# Patient Record
Sex: Male | Born: 1990 | Race: Black or African American | Hispanic: No | Marital: Single | Smoking: Never smoker
Health system: Southern US, Community
[De-identification: ages and names within clinical notes are randomized; demographics above are authoritative.]

---

## 2019-05-17 ENCOUNTER — Emergency Department: Payer: No Typology Code available for payment source

## 2019-05-17 ENCOUNTER — Emergency Department
Admission: EM | Admit: 2019-05-17 | Discharge: 2019-05-17 | Disposition: A | Discharge status: Home / Self Care | Payer: No Typology Code available for payment source | Attending: Emergency Medicine | Admitting: Emergency Medicine

## 2019-05-17 ENCOUNTER — Other Ambulatory Visit: Payer: Self-pay

## 2019-05-17 ENCOUNTER — Encounter: Payer: Self-pay | Admitting: Emergency Medicine

## 2019-05-17 DIAGNOSIS — Z79899 Other long term (current) drug therapy: Secondary | ICD-10-CM | POA: Insufficient documentation

## 2019-05-17 DIAGNOSIS — G8929 Other chronic pain: Secondary | ICD-10-CM | POA: Insufficient documentation

## 2019-05-17 DIAGNOSIS — M542 Cervicalgia: Secondary | ICD-10-CM | POA: Insufficient documentation

## 2019-05-17 DIAGNOSIS — M545 Low back pain: Secondary | ICD-10-CM | POA: Insufficient documentation

## 2019-05-17 DIAGNOSIS — R32 Unspecified urinary incontinence: Secondary | ICD-10-CM | POA: Diagnosis not present

## 2019-05-17 LAB — CBC
HCT: 37.6 % — ABNORMAL LOW (ref 39.0–52.0)
Hemoglobin: 12.3 g/dL — ABNORMAL LOW (ref 13.0–17.0)
MCH: 31.3 pg (ref 26.0–34.0)
MCHC: 32.7 g/dL (ref 30.0–36.0)
MCV: 95.7 fL (ref 80.0–100.0)
Platelets: 151 10*3/uL (ref 150–400)
RBC: 3.93 MIL/uL — ABNORMAL LOW (ref 4.22–5.81)
RDW: 12.6 % (ref 11.5–15.5)
WBC: 6.4 10*3/uL (ref 4.0–10.5)
nRBC: 0 % (ref 0.0–0.2)

## 2019-05-17 LAB — URINALYSIS, COMPLETE (UACMP) WITH MICROSCOPIC
Bacteria, UA: NONE SEEN
Bilirubin Urine: NEGATIVE
Glucose, UA: NEGATIVE mg/dL
Hgb urine dipstick: NEGATIVE
Ketones, ur: NEGATIVE mg/dL
Leukocytes,Ua: NEGATIVE
Nitrite: NEGATIVE
Protein, ur: NEGATIVE mg/dL
Specific Gravity, Urine: 1.027 (ref 1.005–1.030)
pH: 6 (ref 5.0–8.0)

## 2019-05-17 LAB — BASIC METABOLIC PANEL
Anion gap: 8 (ref 5–15)
BUN: 18 mg/dL (ref 6–20)
CO2: 25 mmol/L (ref 22–32)
Calcium: 9.4 mg/dL (ref 8.9–10.3)
Chloride: 108 mmol/L (ref 98–111)
Creatinine, Ser: 1.03 mg/dL (ref 0.61–1.24)
GFR calc Af Amer: >60 mL/min (ref >60)
GFR calc non Af Amer: >60 mL/min (ref >60)
Glucose, Bld: 95 mg/dL (ref 70–99)
Potassium: 4.1 mmol/L (ref 3.5–5.1)
Sodium: 141 mmol/L (ref 135–145)

## 2019-05-17 MED ORDER — IBUPROFEN 600 MG PO TABS: 600.0000 mg | ORAL_TABLET | Freq: Four times a day (QID) | ORAL | 0 refills | Status: DC | PRN

## 2019-05-17 MED ORDER — CYCLOBENZAPRINE HCL 5 MG PO TABS: ORAL_TABLET | ORAL | 0 refills | Status: DC

## 2019-05-17 MED ORDER — IOHEXOL 300 MG/ML  SOLN
100.0000 mL | Freq: Once | INTRAMUSCULAR | Status: AC | PRN
  Administered 2019-05-17: 100 mL via INTRAVENOUS
  Filled 2019-05-17: qty 100
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
## 2019-05-31 ENCOUNTER — Encounter: Payer: Self-pay | Admitting: Intensive Care

## 2019-05-31 ENCOUNTER — Other Ambulatory Visit: Payer: Self-pay

## 2019-05-31 ENCOUNTER — Emergency Department
Admission: EM | Admit: 2019-05-31 | Discharge: 2019-05-31 | Disposition: A | Discharge status: Home / Self Care | Payer: Self-pay | Attending: Emergency Medicine | Admitting: Emergency Medicine

## 2019-05-31 DIAGNOSIS — M7918 Myalgia, other site: Secondary | ICD-10-CM | POA: Insufficient documentation

## 2019-05-31 MED ORDER — IBUPROFEN 600 MG PO TABS: 600.0000 mg | ORAL_TABLET | Freq: Four times a day (QID) | ORAL | 0 refills | Status: DC | PRN

## 2019-05-31 MED ORDER — CYCLOBENZAPRINE HCL 5 MG PO TABS: ORAL_TABLET | ORAL | 0 refills | Status: DC

## 2019-05-31 NOTE — Discharge Instructions (Signed)
Follow-up with your primary care provider or urgent care if any continued problems.  Begin taking Flexeril 5 mg as directed.  Be aware that this medication could cause drowsiness and you should not take it while driving or operating machinery.  Ibuprofen 600 mg every 6 hours with food will help with inflammation and pain.  You may continue using ice or heat to your back as needed for discomfort.
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
## 2020-05-12 ENCOUNTER — Other Ambulatory Visit: Payer: Self-pay

## 2020-05-12 ENCOUNTER — Emergency Department
Admission: EM | Admit: 2020-05-12 | Discharge: 2020-05-13 | Disposition: A | Discharge status: Other Acute Inpatient Hospital | Payer: Self-pay | Attending: Emergency Medicine | Admitting: Emergency Medicine

## 2020-05-12 ENCOUNTER — Encounter: Payer: Self-pay | Admitting: Emergency Medicine

## 2020-05-12 DIAGNOSIS — F918 Other conduct disorders: Secondary | ICD-10-CM | POA: Insufficient documentation

## 2020-05-12 DIAGNOSIS — R4689 Other symptoms and signs involving appearance and behavior: Secondary | ICD-10-CM

## 2020-05-12 DIAGNOSIS — R45850 Homicidal ideations: Secondary | ICD-10-CM

## 2020-05-12 DIAGNOSIS — Z20822 Contact with and (suspected) exposure to covid-19: Secondary | ICD-10-CM | POA: Insufficient documentation

## 2020-05-12 LAB — ACETAMINOPHEN LEVEL: Acetaminophen (Tylenol), Serum: <10 ug/mL — ABNORMAL LOW (ref 10–30)

## 2020-05-12 LAB — COMPREHENSIVE METABOLIC PANEL
ALT: 20 U/L (ref 0–44)
AST: 22 U/L (ref 15–41)
Albumin: 4.8 g/dL (ref 3.5–5.0)
Alkaline Phosphatase: 44 U/L (ref 38–126)
Anion gap: 10 (ref 5–15)
BUN: 15 mg/dL (ref 6–20)
CO2: 25 mmol/L (ref 22–32)
Calcium: 9.3 mg/dL (ref 8.9–10.3)
Chloride: 106 mmol/L (ref 98–111)
Creatinine, Ser: 1.07 mg/dL (ref 0.61–1.24)
GFR calc Af Amer: >60 mL/min (ref >60)
GFR calc non Af Amer: >60 mL/min (ref >60)
Glucose, Bld: 93 mg/dL (ref 70–99)
Potassium: 3.7 mmol/L (ref 3.5–5.1)
Sodium: 141 mmol/L (ref 135–145)
Total Bilirubin: 0.7 mg/dL (ref 0.3–1.2)
Total Protein: 7.2 g/dL (ref 6.5–8.1)

## 2020-05-12 LAB — CBC
HCT: 38.6 % — ABNORMAL LOW (ref 39.0–52.0)
Hemoglobin: 12.7 g/dL — ABNORMAL LOW (ref 13.0–17.0)
MCH: 32.2 pg (ref 26.0–34.0)
MCHC: 32.9 g/dL (ref 30.0–36.0)
MCV: 97.7 fL (ref 80.0–100.0)
Platelets: 180 10*3/uL (ref 150–400)
RBC: 3.95 MIL/uL — ABNORMAL LOW (ref 4.22–5.81)
RDW: 13.1 % (ref 11.5–15.5)
WBC: 5.7 10*3/uL (ref 4.0–10.5)
nRBC: 0 % (ref 0.0–0.2)

## 2020-05-12 LAB — ETHANOL: Alcohol, Ethyl (B): <10 mg/dL (ref <10)

## 2020-05-12 LAB — SALICYLATE LEVEL: Salicylate Lvl: <7 mg/dL — ABNORMAL LOW (ref 7.0–30.0)

## 2020-05-12 NOTE — ED Notes (Addendum)
Patient dressed out by this RN and Mayra EDT and belongings placed in patient belonging bag. Patient with yellow color bractlet, pair of earrings with clear stone, 6 yellow color necklaces, 2 cross charms, 1 leaf charm, shoes, socks, sweat shirt, t-shirt, pants, underwear, car key and 2 cell phones.
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
## 2020-05-12 NOTE — ED Notes (Signed)
Snack and beverage given. 

## 2020-05-12 NOTE — ED Notes (Signed)
Hourly rounding reveals patient awake in room. No complaints, stable, in no acute distress. Q15 minute rounds and monitoring via Rover and Officer to continue.  

## 2020-05-12 NOTE — ED Notes (Signed)
Pt. Transferred from Triage to room 21 after dressing out and screening for contraband. Report to include Situation, Background, Assessment and Recommendations from [facility]. Pt. Oriented to Quad including Q15 minute rounds as well as Psychologist, counselling for their protection. Patient is alert and oriented, warm and dry in no acute distress. Patient denies SI, HI, and AVH. Pt. Encouraged to let me know if needs arise.
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
## 2020-05-13 ENCOUNTER — Encounter: Payer: Self-pay | Admitting: Psychiatric/Mental Health

## 2020-05-13 ENCOUNTER — Inpatient Hospital Stay
Admission: EM | Admit: 2020-05-13 | Discharge: 2020-05-14 | DRG: 882 | Disposition: A | Discharge status: Home / Self Care | Payer: No Typology Code available for payment source | Source: Intra-hospital | Attending: Psychiatry | Admitting: Psychiatry

## 2020-05-13 DIAGNOSIS — F4325 Adjustment disorder with mixed disturbance of emotions and conduct: Secondary | ICD-10-CM | POA: Diagnosis present

## 2020-05-13 DIAGNOSIS — R45850 Homicidal ideations: Secondary | ICD-10-CM | POA: Diagnosis present

## 2020-05-13 DIAGNOSIS — F1210 Cannabis abuse, uncomplicated: Secondary | ICD-10-CM

## 2020-05-13 LAB — URINE DRUG SCREEN, QUALITATIVE (ARMC ONLY)
Amphetamines, Ur Screen: NOT DETECTED
Barbiturates, Ur Screen: NOT DETECTED
Benzodiazepine, Ur Scrn: NOT DETECTED
Cannabinoid 50 Ng, Ur ~~LOC~~: POSITIVE — AB
Cocaine Metabolite,Ur ~~LOC~~: NOT DETECTED
MDMA (Ecstasy)Ur Screen: NOT DETECTED
Methadone Scn, Ur: NOT DETECTED
Opiate, Ur Screen: NOT DETECTED
Phencyclidine (PCP) Ur S: NOT DETECTED
Tricyclic, Ur Screen: NOT DETECTED

## 2020-05-13 LAB — SARS CORONAVIRUS 2 BY RT PCR (HOSPITAL ORDER, PERFORMED IN ~~LOC~~ HOSPITAL LAB): SARS Coronavirus 2: NEGATIVE

## 2020-05-13 MED ORDER — MAGNESIUM HYDROXIDE 400 MG/5ML PO SUSP: 30.0000 mL | Freq: Every day | ORAL | Status: DC | PRN

## 2020-05-13 MED ORDER — HYDROXYZINE HCL 25 MG PO TABS: 25.0000 mg | ORAL_TABLET | Freq: Four times a day (QID) | ORAL | Status: DC | PRN

## 2020-05-13 MED ORDER — TRAZODONE HCL 50 MG PO TABS: 50.0000 mg | ORAL_TABLET | Freq: Every evening | ORAL | Status: DC | PRN

## 2020-05-13 MED ORDER — ACETAMINOPHEN 325 MG PO TABS: 650.0000 mg | ORAL_TABLET | Freq: Four times a day (QID) | ORAL | Status: DC | PRN

## 2020-05-13 MED ORDER — ALUM & MAG HYDROXIDE-SIMETH 200-200-20 MG/5ML PO SUSP: 30.0000 mL | ORAL | Status: DC | PRN

## 2020-05-13 NOTE — [facility] Suicide Risk Assessment (Signed)
[facility] INPATIENT:  Family/Significant Other Suicide Prevention Education  Suicide Prevention Education:  Contact Attempts: Sandria Bales, brother, [phone]  has been identified by the patient as the family member/significant other with whom the patient will be residing, and identified as the person(s) who will aid the patient in the event of a mental health crisis.  With written consent from the patient, two attempts were made to provide suicide prevention education, prior to and/or following the patient's discharge.  We were unsuccessful in providing suicide prevention education.  A suicide education pamphlet was given to the patient to share with family/significant other.  Date and time of first attempt: 05/13/2020 at 11:41AM Date and time of second attempt:  Second attempt is needed.  CSW attempted to leave a HIPAA compliant voicemail but the voicemail has not been set up.   Harden Mo 05/13/2020, 11:40 AM

## 2020-05-13 NOTE — Plan of Care (Signed)
D- Patient alert and oriented. Patient presents in a pleasant mood on assessment reporting that he slept good last night and had no complaints to voice to this Clinical research associate. Patient denies SI, HI, AVH, and pain at this time. Patient also denies any signs/symptoms of depression and anxiety.  Patient's goal for today is "home", in which he will do "whatever it takes", in order to achieve his goal.  A- Support and encouragement provided.  Routine safety checks conducted every 15 minutes. Patient informed to notify staff with problems or concerns.  R- Patient contracts for safety at this time. Patient compliant with treatment plan. Patient receptive, calm, and cooperative. Patient interacts well with others on the unit.  Patient remains safe at this time.  Problem: Education: Goal: Knowledge of [place] [facility] Education information/materials will improve Outcome: Progressing Goal: Emotional status will improve Outcome: Progressing Goal: Mental status will improve Outcome: Progressing Goal: Verbalization of understanding the information provided will improve Outcome: Progressing   Problem: Activity: Goal: Interest or engagement in activities will improve Outcome: Progressing Goal: Sleeping patterns will improve Outcome: Progressing   Problem: Coping: Goal: Ability to verbalize frustrations and anger appropriately will improve Outcome: Progressing Goal: Ability to demonstrate self-control will improve Outcome: Progressing   Problem: Health Behavior/Discharge Planning: Goal: Identification of resources available to assist in meeting health care needs will improve Outcome: Progressing Goal: Compliance with treatment plan for underlying cause of condition will improve Outcome: Progressing   Problem: Physical Regulation: Goal: Ability to maintain clinical measurements within normal limits will improve Outcome: Progressing   Problem: Safety: Goal: Periods of time without injury will  increase Outcome: Progressing   Problem: Education: Goal: Utilization of techniques to improve thought processes will improve Outcome: Progressing Goal: Knowledge of the prescribed therapeutic regimen will improve Outcome: Progressing   Problem: Activity: Goal: Interest or engagement in leisure activities will improve Outcome: Progressing Goal: Imbalance in normal sleep/wake cycle will improve Outcome: Progressing   Problem: Coping: Goal: Coping ability will improve Outcome: Progressing Goal: Will verbalize feelings Outcome: Progressing   Problem: Health Behavior/Discharge Planning: Goal: Ability to make decisions will improve Outcome: Progressing Goal: Compliance with therapeutic regimen will improve Outcome: Progressing   Problem: Role Relationship: Goal: Will demonstrate positive changes in social behaviors and relationships Outcome: Progressing   Problem: Safety: Goal: Ability to disclose and discuss suicidal ideas will improve Outcome: Progressing Goal: Ability to identify and utilize support systems that promote safety will improve Outcome: Progressing   Problem: Self-Concept: Goal: Will verbalize positive feelings about self Outcome: Progressing Goal: Level of anxiety will decrease Outcome: Progressing
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
## 2020-05-13 NOTE — ED Notes (Signed)
Hourly rounding reveals patient awake in room. No complaints, stable, in no acute distress. Q15 minute rounds and monitoring via Rover and Officer to continue.  

## 2020-05-13 NOTE — ED Notes (Signed)
Patient has been accepted to the [facility], room 320.  ED Staff aware:  Accepting Physician- Toni Amend, MD Psych NPLodema Pilot, NP ED RN- Jillyn Hidden ED Patient Access- Stevan Born

## 2020-05-13 NOTE — [facility] Suicide Risk Assessment (Signed)
[facility] Admission Suicide Risk Assessment   Nursing information obtained from:  Patient, Review of record Demographic factors:  Male, Adolescent or young adult Current Mental Status:  Suicidal ideation indicated by others Loss Factors:  Loss of significant relationship Historical Factors:  Impulsivity Risk Reduction Factors:  Responsible for children under 28 years of age  Total Time spent with patient: 1 hour Principal Problem: Adjustment disorder with mixed disturbance of emotions and conduct Diagnosis:  Principal Problem:   Adjustment disorder with mixed disturbance of emotions and conduct Active Problems:   Cannabis abuse  Subjective Data: Patient seen and chart reviewed.  29 year old man petition by his girlfriend with reports that he had made threatening statements and text messages towards her.  Also some suggestion that he may have had some suicidality and some of the messages.  On interview today the patient presents as very energetic talkative with a somewhat labile affect.  He denies any suicidal or homicidal ideation.  He has rationalizations for the messages insisting that he did not mean that he was going to harm anyone.  Continued Clinical Symptoms:  Alcohol Use Disorder Identification Test Final Score (AUDIT): 2 The "Alcohol Use Disorders Identification Test", Guidelines for Use in Primary Care, Second Edition.  World Science writer [facility]). Score between 0-7:  no or low risk or alcohol related problems. Score between 8-15:  moderate risk of alcohol related problems. Score between 16-19:  high risk of alcohol related problems. Score 20 or above:  warrants further diagnostic evaluation for alcohol dependence and treatment.   CLINICAL FACTORS:   Severe Anxiety and/or Agitation   Musculoskeletal: Strength & Muscle Tone: within normal limits Gait & Station: normal Patient leans: N/A  Psychiatric Specialty Exam: Physical Exam Vitals and nursing note reviewed.   Constitutional:      Appearance: He is well-developed.  HENT:     Head: Normocephalic and atraumatic.  Eyes:     Conjunctiva/sclera: Conjunctivae normal.     Pupils: Pupils are equal, round, and reactive to light.  Cardiovascular:     Heart sounds: Normal heart sounds.  Pulmonary:     Effort: Pulmonary effort is normal.  Abdominal:     Palpations: Abdomen is soft.  Musculoskeletal:        General: Normal range of motion.     Cervical back: Normal range of motion.  Skin:    General: Skin is warm and dry.  Neurological:     General: No focal deficit present.     Mental Status: He is alert.  Psychiatric:        Attention and Perception: Attention normal.        Mood and Affect: Affect is labile.        Speech: Speech normal.        Behavior: Behavior is agitated. Behavior is not aggressive.        Thought Content: Thought content is not paranoid. Thought content does not include homicidal or suicidal ideation.        Cognition and Memory: Cognition normal.        Judgment: Judgment is impulsive.     Review of Systems  Constitutional: Negative.   HENT: Negative.   Eyes: Negative.   Respiratory: Negative.   Cardiovascular: Negative.   Gastrointestinal: Negative.   Musculoskeletal: Negative.   Skin: Negative.   Neurological: Negative.   Psychiatric/Behavioral: Positive for agitation. Negative for behavioral problems, confusion, decreased concentration, dysphoric mood, hallucinations, self-injury, sleep disturbance and suicidal ideas. The patient is nervous/anxious and is  hyperactive.     Blood pressure 121/68, pulse 73, temperature 98.2 F (36.8 C), temperature source Oral, resp. rate 18, height 6' (1.829 m), weight 81.6 kg, SpO2 98 %.Body mass index is 24.41 kg/m.  General Appearance: Casual  Eye Contact:  Good  Speech:  Clear and Coherent  Volume:  Increased  Mood:  Euthymic  Affect:  Labile  Thought Process:  Coherent  Orientation:  Full (Time, Place, and Person)   Thought Content:  Logical and Tangential  Suicidal Thoughts:  No  Homicidal Thoughts:  No  Memory:  Immediate;   Fair Recent;   Fair Remote;   Fair  Judgement:  Fair  Insight:  Shallow  Psychomotor Activity:  Increased  Concentration:  Concentration: Fair  Recall:  [employer] of Knowledge:  Fair  Language:  Fair  Akathisia:  No  Handed:  Right  AIMS (if indicated):     Assets:  Desire for Improvement Housing Physical Health Resilience Social Support  ADL's:  Intact  Cognition:  WNL  Sleep:  Number of Hours: 4      COGNITIVE FEATURES THAT CONTRIBUTE TO RISK:  Polarized thinking    SUICIDE RISK:   Minimal: No identifiable suicidal ideation.  Patients presenting with no risk factors but with morbid ruminations; may be classified as minimal risk based on the severity of the depressive symptoms  PLAN OF CARE: Patient will be monitored on 15-minute checks.  Monitored with assessment and supportive counseling when possible from staff.  Reassess dangerousness prior to discharge planning  I certify that inpatient services furnished can reasonably be expected to improve the patient's condition.   Alethia Berthold, MD 05/13/2020, 4:20 PM

## 2020-05-13 NOTE — Progress Notes (Signed)
Recreation Therapy Notes  Date: 05/13/2020  Time: 9:30 am   Location: Craft room    Behavioral response: N/A   Intervention Topic: Coping Skills    Discussion/Intervention: Patient did not attend group.   Clinical Observations/Feedback:  Patient did not attend group.   Chelesea Weiand LRT/CTRS        Halston Fairclough 05/13/2020 11:09 AM

## 2020-05-13 NOTE — H&P (Signed)
Psychiatric Admission Assessment Adult  Patient Identification: Anthony Zimmerman MRN:  [id number] Date of Evaluation:  05/13/2020 Chief Complaint:  Homicidal ideation [R45.850] Principal Diagnosis: Adjustment disorder with mixed disturbance of emotions and conduct Diagnosis:  Principal Problem:   Adjustment disorder with mixed disturbance of emotions and conduct Active Problems:   Cannabis abuse  History of Present Illness: Patient seen and chart reviewed.  29 year old man brought to the hospital under IVC filed by his girlfriend with reports that he had sent threatening text messages implying possible violence towards her or towards himself.  On interview today the patient presents as hyperverbal very animated but not obviously disorganized or psychotic.  Able to be redirected easily.  Does not make any delusional statements.  He admits to having texted his girlfriend some of the things mentioned but insists that he was just irritated with her and in no way meant to harm her.  He denies having ever been physically violent towards her.  He denies any suicidal ideation.  Patient insists that he is sleeping fine at night that his mood feels normal that he has no change in any of his usual demeanor or mood.  Admits that he smokes marijuana pretty much all day every day.  Also that he spends pretty much all of his time playing video games and is not working.  He talks about how his grandmother died about 2 months ago which was a very emotional event for him but does not describe depression.  When talking about his grandmother he becomes tearful but then switches back into very animated and upbeat mood otherwise.  Patient insists he has no history of mental health problems other than having been treated for ADHD as a child Associated Signs/Symptoms: Depression Symptoms:  psychomotor agitation, (Hypo) Manic Symptoms:  Impulsivity, Labiality of Mood, Anxiety Symptoms:  Denies any Psychotic Symptoms:  Denies  any PTSD Symptoms: Negative Total Time spent with patient: 1 hour  Past Psychiatric History: Patient says he was treated for ADHD with Ritalin when he was a child in third through fifth grade.  Sounds like it was effective.  No other psychiatric treatment.  Never been in a psychiatric hospital.  No medicine as an adult.  Denies ever having tried to harm himself or any suicide attempts.  Denies being violent to others.  Denies any hallucinations  Is the patient at risk to self? No.  Has the patient been a risk to self in the past 6 months? No.  Has the patient been a risk to self within the distant past? No.  Is the patient a risk to others? Yes.    Has the patient been a risk to others in the past 6 months? No.  Has the patient been a risk to others within the distant past? No.   Prior Inpatient Therapy:   Prior Outpatient Therapy:    Alcohol Screening: 1. How often do you have a drink containing alcohol?: Monthly or less 2. How many drinks containing alcohol do you have on a typical day when you are drinking?: 3 or 4 3. How often do you have six or more drinks on one occasion?: Never AUDIT-C Score: 2 4. How often during the last year have you found that you were not able to stop drinking once you had started?: Never 5. How often during the last year have you failed to do what was normally expected from you because of drinking?: Never 6. How often during the last year have you needed a  first drink in the morning to get yourself going after a heavy drinking session?: Never 7. How often during the last year have you had a feeling of guilt of remorse after drinking?: Never 8. How often during the last year have you been unable to remember what happened the night before because you had been drinking?: Never 9. Have you or someone else been injured as a result of your drinking?: No 10. Has a relative or friend or a doctor or another health worker been concerned about your drinking or suggested  you cut down?: No Alcohol Use Disorder Identification Test Final Score (AUDIT): 2 Alcohol Brief Interventions/Follow-up: Brief Advice Substance Abuse History in the last 12 months:  Yes.   Consequences of Substance Abuse: Not clear as is often the case with heavy marijuana use.  I suspect it could be contributing to some loosening in his thought process.  No clear medical issue however Previous Psychotropic Medications: Yes  Psychological Evaluations: No  Past Medical History: History reviewed. No pertinent past medical history. History reviewed. No pertinent surgical history. Family History: History reviewed. No pertinent family history. Family Psychiatric  History: Denies there being any family history of mental health problems Tobacco Screening: Have you used any form of tobacco in the last 30 days? (Cigarettes, Smokeless Tobacco, Cigars, and/or Pipes): No Social History:  Social History   Substance and Sexual Activity  Alcohol Use Yes     Social History   Substance and Sexual Activity  Drug Use Yes  . Frequency: 1.0 times per week  . Types: Marijuana    Additional Social History: Marital status: Long term relationship Long term relationship, how long?: "6 years" What types of issues is patient dealing with in the relationship?: "I've cheated on her" Does patient have children?: Yes How many children?: 1 How is patient's relationship with their children?: "golden"                         Allergies:   Allergies  Allergen Reactions  . Septra [Sulfamethoxazole-Trimethoprim] Hives   Lab Results:  Results for orders placed or performed during the hospital encounter of 05/13/20 (from the past 48 hour(s))  Urine Drug Screen, Qualitative ([facility] only)     Status: Abnormal   Collection Time: 05/13/20 12:35 PM  Result Value Ref Range   Tricyclic, Ur Screen NONE DETECTED NONE DETECTED   Amphetamines, Ur Screen NONE DETECTED NONE DETECTED   MDMA (Ecstasy)Ur Screen NONE  DETECTED NONE DETECTED   Cocaine Metabolite,Ur [place] NONE DETECTED NONE DETECTED   Opiate, Ur Screen NONE DETECTED NONE DETECTED   Phencyclidine (PCP) Ur S NONE DETECTED NONE DETECTED   Cannabinoid 50 Ng, Ur [place] POSITIVE (A) NONE DETECTED   Barbiturates, Ur Screen NONE DETECTED NONE DETECTED   Benzodiazepine, Ur Scrn NONE DETECTED NONE DETECTED   Methadone Scn, Ur NONE DETECTED NONE DETECTED    Comment: (NOTE) Tricyclics + metabolites, urine    Cutoff 1000 ng/mL Amphetamines + metabolites, urine  Cutoff 1000 ng/mL MDMA (Ecstasy), urine              Cutoff 500 ng/mL Cocaine Metabolite, urine          Cutoff 300 ng/mL Opiate + metabolites, urine        Cutoff 300 ng/mL Phencyclidine (PCP), urine         Cutoff 25 ng/mL Cannabinoid, urine                 Cutoff  50 ng/mL Barbiturates + metabolites, urine  Cutoff 200 ng/mL Benzodiazepine, urine              Cutoff 200 ng/mL Methadone, urine                   Cutoff 300 ng/mL  The urine drug screen provides only a preliminary, unconfirmed analytical test result and should not be used for non-medical purposes. Clinical consideration and professional judgment should be applied to any positive drug screen result due to possible interfering substances. A more specific alternate chemical method must be used in order to obtain a confirmed analytical result. Gas chromatography / mass spectrometry (GC/MS) is the preferred confirm atory method. Performed at [facility], [address] [place], [place] [address]     Blood Alcohol level:  Lab Results  Component Value Date   [facility]    Metabolic Disorder Labs:  No results found for: HGBA1C, MPG No results found for: PROLACTIN No results found for: CHOL, TRIG, HDL, CHOLHDL, VLDL, LDLCALC  Current Medications: Current Facility-Administered Medications  Medication Dose Route Frequency Provider Last Rate Last Admin  . acetaminophen (TYLENOL) tablet 650 mg  650 mg Oral Q6H  PRN Dixon, Rashaun M, NP      . alum & mag hydroxide-simeth (MAALOX/MYLANTA) 200-200-20 MG/5ML suspension 30 mL  30 mL Oral Q4H PRN Dixon, Rashaun M, NP      . hydrOXYzine (ATARAX/VISTARIL) tablet 25 mg  25 mg Oral Q6H PRN Dixon, Rashaun M, NP      . magnesium hydroxide (MILK OF MAGNESIA) suspension 30 mL  30 mL Oral Daily PRN Dixon, Rashaun M, NP      . traZODone (DESYREL) tablet 50 mg  50 mg Oral QHS PRN Jearld Leschixon, Rashaun M, NP       PTA Medications: Medications Prior to Admission  Medication Sig Dispense Refill Last Dose  . cyclobenzaprine (FLEXERIL) 5 MG tablet Take 1-2 tablets 3 times daily as needed 15 tablet 0   . ibuprofen (ADVIL) 600 MG tablet Take 1 tablet (600 mg total) by mouth every 6 (six) hours as needed. 30 tablet 0     Musculoskeletal: Strength & Muscle Tone: within normal limits Gait & Station: normal Patient leans: N/A  Psychiatric Specialty Exam: Physical Exam Vitals and nursing note reviewed.  Constitutional:      Appearance: He is well-developed.  HENT:     Head: Normocephalic and atraumatic.  Eyes:     Conjunctiva/sclera: Conjunctivae normal.     Pupils: Pupils are equal, round, and reactive to light.  Cardiovascular:     Heart sounds: Normal heart sounds.  Pulmonary:     Effort: Pulmonary effort is normal.  Abdominal:     Palpations: Abdomen is soft.  Musculoskeletal:        General: Normal range of motion.     Cervical back: Normal range of motion.  Skin:    General: Skin is warm and dry.  Neurological:     General: No focal deficit present.     Mental Status: He is alert.  Psychiatric:        Attention and Perception: Attention normal.        Mood and Affect: Affect is labile.        Speech: Speech normal.        Behavior: Behavior is agitated. Behavior is not aggressive.        Thought Content: Thought content is not paranoid. Thought content does not include homicidal or  suicidal ideation.        Cognition and Memory: Cognition normal.         Judgment: Judgment is impulsive.     Review of Systems  Constitutional: Negative.   HENT: Negative.   Eyes: Negative.   Respiratory: Negative.   Cardiovascular: Negative.   Gastrointestinal: Negative.   Musculoskeletal: Negative.   Skin: Negative.   Neurological: Negative.   Psychiatric/Behavioral: Positive for agitation and behavioral problems. Negative for sleep disturbance and suicidal ideas. The patient is nervous/anxious.     Blood pressure 121/68, pulse 73, temperature 98.2 F (36.8 C), temperature source Oral, resp. rate 18, height 6' (1.829 m), weight 81.6 kg, SpO2 98 %.Body mass index is 24.41 kg/m.  General Appearance: Casual  Eye Contact:  Good  Speech:  Clear and Coherent  Volume:  Increased  Mood:  Euthymic  Affect:  Labile  Thought Process:  Coherent  Orientation:  Full (Time, Place, and Person)  Thought Content:  Logical and Tangential  Suicidal Thoughts:  No  Homicidal Thoughts:  No  Memory:  Immediate;   Fair Recent;   Fair Remote;   Fair  Judgement:  Fair  Insight:  Shallow  Psychomotor Activity:  Normal  Concentration:  Concentration: Fair  Recall:  [employer] of Knowledge:  Fair  Language:  Fair  Akathisia:  No  Handed:  Right  AIMS (if indicated):     Assets:  Desire for Improvement Housing Physical Health Resilience Social Support  ADL's:  Intact  Cognition:  WNL  Sleep:  Number of Hours: 4    Treatment Plan Summary: Daily contact with patient to assess and evaluate symptoms and progress in treatment, Medication management and Plan 29 year old man petitioned on the grounds of threatening statements towards his girlfriend.  He presents with hyper verbality and a somewhat labile mood.  Has been laughing all day and using the telephone constantly.  On the other hand he is able to calm down able to hold lucid conversations and has not been grandiose or bizarre in his behavior here.  Question would be whether this could be bipolar disorder versus  ADHD personality and substance.  I discussed this with the patient and of course he insists that he could not possibly have bipolar disorder.  I advised him that nonetheless we would be monitoring him overnight at least and reassess tomorrow.  He informs me he does not want to take any medicine so we will not try starting anything right now.  Labs are all pretty stable no obvious need for other medical work-up yet.  Reassess tomorrow.  Observation Level/Precautions:  15 minute checks  Laboratory:  UDS  Psychotherapy:    Medications:    Consultations:    Discharge Concerns:    Estimated LOS:  Other:     Physician Treatment Plan for Primary Diagnosis: Adjustment disorder with mixed disturbance of emotions and conduct Long Term Goal(s): Improvement in symptoms so as ready for discharge  Short Term Goals: Ability to demonstrate self-control will improve and Ability to identify and develop effective coping behaviors will improve  Physician Treatment Plan for Secondary Diagnosis: Principal Problem:   Adjustment disorder with mixed disturbance of emotions and conduct Active Problems:   Cannabis abuse  Long Term Goal(s): Improvement in symptoms so as ready for discharge  Short Term Goals: Ability to identify triggers associated with substance abuse/mental health issues will improve  I certify that inpatient services furnished can reasonably be expected to improve the patient's condition.  Mordecai Rasmussen, MD 6/28/20214:24 PM

## 2020-05-13 NOTE — Plan of Care (Signed)
  Problem: Education: Goal: Knowledge of [facility] Education information/materials will improve Outcome: Progressing Goal: Emotional status will improve Outcome: Progressing Goal: Mental status will improve Outcome: Progressing Goal: Verbalization of understanding the information provided will improve Outcome: Progressing   Problem: Activity: Goal: Interest or engagement in activities will improve Outcome: Progressing Goal: Sleeping patterns will improve Outcome: Progressing   

## 2020-05-13 NOTE — Tx Team (Signed)
Initial Treatment Plan 05/13/2020 2:04 AM Anthony Zimmerman YVO:[id number]    PATIENT STRESSORS: Marital or family conflict   PATIENT STRENGTHS: Active sense of humor Average or above average intelligence Capable of independent living Communication skills General fund of knowledge Physical Health   PATIENT IDENTIFIED PROBLEMS:       SI/HI               DISCHARGE CRITERIA:  Ability to meet basic life and health needs Adequate post-discharge living arrangements Improved stabilization in mood, thinking, and/or behavior Medical problems require only outpatient monitoring Motivation to continue treatment in a less acute level of care Need for constant or close observation no longer present Reduction of life-threatening or endangering symptoms to within safe limits Safe-care adequate arrangements made Verbal commitment to aftercare and medication compliance  PRELIMINARY DISCHARGE PLAN: Outpatient therapy Return to previous living arrangement  PATIENT/FAMILY INVOLVEMENT: This treatment plan has been presented to and reviewed with the patient, Anthony Zimmerman, and/or family member.  The patient and family have been given the opportunity to ask questions and make suggestions.  Billy Coast, RN 05/13/2020, 2:04 AM

## 2020-05-13 NOTE — [facility] Group Notes (Signed)
LCSW Group Therapy Note   05/13/2020 2:31 PM  Type of Therapy and Topic:  Group Therapy:  Overcoming Obstacles   Participation Level:  Active   Description of Group:    In this group patients will be encouraged to explore what they see as obstacles to their own wellness and recovery. They will be guided to discuss their thoughts, feelings, and behaviors related to these obstacles. The group will process together ways to cope with barriers, with attention given to specific choices patients can make. Each patient will be challenged to identify changes they are motivated to make in order to overcome their obstacles. This group will be process-oriented, with patients participating in exploration of their own experiences as well as giving and receiving support and challenge from other group members.   Therapeutic Goals: 1. Patient will identify personal and current obstacles as they relate to admission. 2. Patient will identify barriers that currently interfere with their wellness or overcoming obstacles.  3. Patient will identify feelings, thought process and behaviors related to these barriers. 4. Patient will identify two changes they are willing to make to overcome these obstacles:      Summary of Patient Progress Patient was an active participant in group.  Patient engaged in group discussion on being assertive and willing to compromise.  Patient shared that he wished he could provide others in his personal life with the information shared in group.    Therapeutic Modalities:   Cognitive Behavioral Therapy Solution Focused Therapy Motivational Interviewing Relapse Prevention Therapy  Penni Homans, MSW, LCSW 05/13/2020 2:31 PM

## 2020-05-14 NOTE — [facility] Suicide Risk Assessment (Signed)
[facility] INPATIENT:  Family/Significant Other Suicide Prevention Education  Suicide Prevention Education:  Education Completed; Sandria Bales, brother, [phone], has been identified by the patient as the family member/significant other with whom the patient will be residing, and identified as the person(s) who will aid the patient in the event of a mental health crisis (suicidal ideations/suicide attempt).  With written consent from the patient, the family member/significant other has been provided the following suicide prevention education, prior to the and/or following the discharge of the patient.  The suicide prevention education provided includes the following:  Suicide risk factors  Suicide prevention and interventions  National Suicide Hotline telephone number  [facility] assessment telephone number  [facility] Emergency Assistance 911  [facility] and/or Residential Mobile Crisis Unit telephone number  Request made of family/significant other to:  Remove weapons (e.g., guns, rifles, knives), all items previously/currently identified as safety concern.    Remove drugs/medications (over-the-counter, prescriptions, illicit drugs), all items previously/currently identified as a safety concern.  The family member/significant other verbalizes understanding of the suicide prevention education information provided.  The family member/significant other agrees to remove the items of safety concern listed above.  Brother reports "they need to separate, they just like to get back at each other".  He reports that he does not believe that the patient is a danger to self or others and does not have access to weapons.   Harden Mo 05/14/2020, 10:53 AM

## 2020-05-14 NOTE — Progress Notes (Signed)
°  [facility] Adult Case Management Discharge Plan :  Will you be returning to the same living situation after discharge:  No. At discharge, do you have transportation home?: Yes,  pt reports that his father will provide transportation.  Do you have the ability to pay for your medications: No.  Release of information consent forms completed and in the chart;  Patient's signature needed at discharge.  Patient to Follow up at:  Follow-up Information    [facility] Follow up.   Why: Your appointment with Lorella Nimrod, Peer Support Specialist at 05/17/2020 at [facility] via Zoom.  Thanks! Contact information: [address] [place] [address] [phone]               Next level of care provider has access to [facility] Link:no  Safety Planning and Suicide Prevention discussed: Yes,  SPE completed with the pt.  SPE attempts have been made for pt brother.  Have you used any form of tobacco in the last 30 days? (Cigarettes, Smokeless Tobacco, Cigars, and/or Pipes): No  Has patient been referred to the Quitline?: N/A patient is not a smoker  Patient has been referred for addiction treatment: Pt. refused referral  Harden Mo, LCSW 05/14/2020, 10:51 AM

## 2020-05-14 NOTE — Progress Notes (Signed)
Recreation Therapy Notes   Date: 05/14/2020  Time: 9:30 am  Location: Craft room   Behavioral response: Appropriate  Intervention Topic: Relaxation   Discussion/Intervention:  Group content today was focused on relaxation. The group defined relaxation and identified healthy ways to relax. Individuals expressed how much time they spend relaxing. Patients expressed how much their life would be if they did not make time for themselves to relax. The group stated ways they could improve their relaxation techniques in the future.  Individuals participated in the intervention "Time to Relax" where they had a chance to experience different relaxation techniques.  Clinical Observations/Feedback:  Patient came to group and defined relaxation as listening to music and going out with peers. He stated that relaxation allows you to express your feelings. Individual was social with peers and staff while participating in the intervention during group.  Gunda Maqueda LRT/CTRS         Samon Dishner 05/14/2020 11:35 AM

## 2020-05-14 NOTE — [facility] Suicide Risk Assessment (Signed)
Anthony Zimmerman, [place] Discharge Suicide Risk Assessment   Principal Problem: Adjustment disorder with mixed disturbance of emotions and conduct Discharge Diagnoses: Principal Problem:   Adjustment disorder with mixed disturbance of emotions and conduct Active Problems:   Cannabis abuse   Total Time spent with patient: 30 minutes  Musculoskeletal: Strength & Muscle Tone: within normal limits Gait & Station: normal Patient leans: N/A  Psychiatric Specialty Exam: Review of Systems  Constitutional: Negative.   HENT: Negative.   Eyes: Negative.   Respiratory: Negative.   Cardiovascular: Negative.   Gastrointestinal: Negative.   Musculoskeletal: Negative.   Skin: Negative.   Neurological: Negative.   Psychiatric/Behavioral: Negative.     Blood pressure 113/70, pulse 86, temperature 98.2 F (36.8 C), temperature source Oral, resp. rate 18, height 6' (1.829 m), weight 81.6 kg, SpO2 97 %.Body mass index is 24.41 kg/m.  General Appearance: Casual  Eye Contact::  Good  Speech:  Clear and Coherent409  Volume:  Increased  Mood:  Euthymic  Affect:  Congruent  Thought Process:  Goal Directed  Orientation:  Full (Time, Place, and Person)  Thought Content:  Logical  Suicidal Thoughts:  No  Homicidal Thoughts:  No  Memory:  Immediate;   Fair Recent;   Fair Remote;   Fair  Judgement:  Fair  Insight:  Fair  Psychomotor Activity:  Normal  Concentration:  Fair  Recall:  [employer] of Knowledge:Fair  Language: Fair  Akathisia:  No  Handed:  Right  AIMS (if indicated):     Assets:  Housing Physical Health Resilience  Sleep:  Number of Hours: 8  Cognition: WNL  ADL's:  Intact   Mental Status Per Nursing Assessment::   On Admission:  Suicidal ideation indicated by others  Demographic Factors:  Male and Unemployed  Loss Factors: Loss of significant relationship  Historical Factors: Impulsivity  Risk Reduction Factors:   Responsible for children under 11 years of age, Living with  another person, especially a relative, Positive social support and Positive therapeutic relationship  Continued Clinical Symptoms:  Alcohol/Substance Abuse/Dependencies  Cognitive Features That Contribute To Risk:  None    Suicide Risk:  Minimal: No identifiable suicidal ideation.  Patients presenting with no risk factors but with morbid ruminations; may be classified as minimal risk based on the severity of the depressive symptoms   Follow-up Information    [facility] Follow up.   Why: Your appointment with Anthony Zimmerman, Peer Support Specialist at 05/17/2020 at [facility] via Zoom.  Thanks! Contact information: [address] [place] [address] [phone]               Plan Of Care/Follow-up recommendations:  Activity:  Activity as tolerated Diet:  Regular diet Other:  Encourage and support follow-up treatment with [facility]  Anthony Rasmussen, MD 05/14/2020, 9:39 AM

## 2020-05-14 NOTE — Tx Team (Addendum)
Interdisciplinary Treatment and Diagnostic Plan Update  05/14/2020 Time of Session: 9:00AM Anthony Zimmerman MRN: [id number]  Principal Diagnosis: Adjustment disorder with mixed disturbance of emotions and conduct  Secondary Diagnoses: Principal Problem:   Adjustment disorder with mixed disturbance of emotions and conduct Active Problems:   Cannabis abuse   Current Medications:  Current Facility-Administered Medications  Medication Dose Route Frequency Provider Last Rate Last Admin  . acetaminophen (TYLENOL) tablet 650 mg  650 mg Oral Q6H PRN Dixon, Rashaun M, NP      . alum & mag hydroxide-simeth (MAALOX/MYLANTA) 200-200-20 MG/5ML suspension 30 mL  30 mL Oral Q4H PRN Dixon, Rashaun M, NP      . hydrOXYzine (ATARAX/VISTARIL) tablet 25 mg  25 mg Oral Q6H PRN Dixon, Rashaun M, NP      . magnesium hydroxide (MILK OF MAGNESIA) suspension 30 mL  30 mL Oral Daily PRN Dixon, Rashaun M, NP      . traZODone (DESYREL) tablet 50 mg  50 mg Oral QHS PRN Jearld Lesch, NP       PTA Medications: Medications Prior to Admission  Medication Sig Dispense Refill Last Dose  . cyclobenzaprine (FLEXERIL) 5 MG tablet Take 1-2 tablets 3 times daily as needed 15 tablet 0   . ibuprofen (ADVIL) 600 MG tablet Take 1 tablet (600 mg total) by mouth every 6 (six) hours as needed. 30 tablet 0     Patient Stressors: Marital or family conflict  Patient Strengths: Active sense of humor Average or above average intelligence Capable of independent living Communication skills General fund of knowledge Physical Health  Treatment Modalities: Medication Management, Group therapy, Case management,  1 to 1 session with clinician, Psychoeducation, Recreational therapy.   Physician Treatment Plan for Primary Diagnosis: Adjustment disorder with mixed disturbance of emotions and conduct Long Term Goal(s): Improvement in symptoms so as ready for discharge Improvement in symptoms so as ready for discharge   Short Term  Goals: Ability to demonstrate self-control will improve Ability to identify and develop effective coping behaviors will improve Ability to identify triggers associated with substance abuse/mental health issues will improve  Medication Management: Evaluate patient's response, side effects, and tolerance of medication regimen.  Therapeutic Interventions: 1 to 1 sessions, Unit Group sessions and Medication administration.  Evaluation of Outcomes: Adequate for Discharge  Physician Treatment Plan for Secondary Diagnosis: Principal Problem:   Adjustment disorder with mixed disturbance of emotions and conduct Active Problems:   Cannabis abuse  Long Term Goal(s): Improvement in symptoms so as ready for discharge Improvement in symptoms so as ready for discharge   Short Term Goals: Ability to demonstrate self-control will improve Ability to identify and develop effective coping behaviors will improve Ability to identify triggers associated with substance abuse/mental health issues will improve     Medication Management: Evaluate patient's response, side effects, and tolerance of medication regimen.  Therapeutic Interventions: 1 to 1 sessions, Unit Group sessions and Medication administration.  Evaluation of Outcomes: Adequate for Discharge   RN Treatment Plan for Primary Diagnosis: Adjustment disorder with mixed disturbance of emotions and conduct Long Term Goal(s): Knowledge of disease and therapeutic regimen to maintain health will improve  Short Term Goals: Ability to demonstrate self-control, Ability to participate in decision making will improve, Ability to verbalize feelings will improve, Ability to identify and develop effective coping behaviors will improve and Compliance with prescribed medications will improve  Medication Management: RN will administer medications as ordered by provider, will assess and evaluate patient's response and provide  education to patient for prescribed  medication. RN will report any adverse and/or side effects to prescribing provider.  Therapeutic Interventions: 1 on 1 counseling sessions, Psychoeducation, Medication administration, Evaluate responses to treatment, Monitor vital signs and CBGs as ordered, Perform/monitor CIWA, COWS, AIMS and Fall Risk screenings as ordered, Perform wound care treatments as ordered.  Evaluation of Outcomes: Adequate for Discharge   LCSW Treatment Plan for Primary Diagnosis: Adjustment disorder with mixed disturbance of emotions and conduct Long Term Goal(s): Safe transition to appropriate next level of care at discharge, Engage patient in therapeutic group addressing interpersonal concerns.  Short Term Goals: Engage patient in aftercare planning with referrals and resources, Increase social support, Increase ability to appropriately verbalize feelings, Increase emotional regulation and Increase skills for wellness and recovery  Therapeutic Interventions: Assess for all discharge needs, 1 to 1 time with Social worker, Explore available resources and support systems, Assess for adequacy in community support network, Educate family and significant other(s) on suicide prevention, Complete Psychosocial Assessment, Interpersonal group therapy.  Evaluation of Outcomes: Adequate for Discharge   Progress in Treatment: Attending groups: Yes. Participating in groups: Yes. Taking medication as prescribed: Yes. Toleration medication: Yes. Family/Significant other contact made: Yes, individual(s) contacted:  SPE completed with patient, SPE attempts with collateral have been attempted. Patient understands diagnosis: Yes. Discussing patient identified problems/goals with staff: Yes. Medical problems stabilized or resolved: Yes. Denies suicidal/homicidal ideation: Yes. Issues/concerns per patient self-inventory: No. Other: none  New problem(s) identified: No, Describe:  none  New Short Term/Long Term Goal(s):  medication management for mood stabilization; elimination of SI thoughts; development of comprehensive mental wellness/sobriety plan.  Patient Goals:  "time to reflect"  Discharge Plan or Barriers: Pt reports that he plans to stay with a brother to give him and the mother of his child some space. He reports plans to follow up with [facility] for continued treatment following discharge.    Reason for Continuation of Hospitalization: Aggression Anxiety Depression  Estimated Length of Stay:  1-5 days  Recreational Therapy: Patient: N/A Patient Goal: Patient will engage in groups without prompting or encouragement from LRT x3 group sessions within 5 recreation therapy group sessions.  Attendees: Patient: Anthony Zimmerman. 05/14/2020 9:23 AM  Physician: Dr. Toni Amend, MD 05/14/2020 9:23 AM  Nursing: Torrie Mayers, RN 05/14/2020 9:23 AM  RN Care Manager: 05/14/2020 9:23 AM  Social Worker: Penni Homans, LCSW 05/14/2020 9:23 AM  Recreational Therapist: Garret Reddish, Drue Flirt, LRT 05/14/2020 9:23 AM  Other: Lowella Dandy, LCSW 05/14/2020 9:23 AM  Other:  05/14/2020 9:23 AM  Other: 05/14/2020 9:23 AM    Scribe for Treatment Team: Harden Mo, LCSW 05/14/2020 9:23 AM

## 2020-05-14 NOTE — Progress Notes (Signed)
Pt denies SI, HI and AVH. Pt was educated on dc plan and verbalizes understanding. Pt received dc packet and belongings. Hillman Attig RN 

## 2020-05-14 NOTE — Discharge Summary (Signed)
Physician Discharge Summary Note  Patient:  Anthony Zimmerman is an 29 y.o., male MRN:  [id number] DOB:  1991/10/30 Patient phone:  [phone] (home)  Patient address:   [address] [place] [address],  Total Time spent with patient: 30 minutes  Date of Admission:  05/13/2020 Date of Discharge: 05/14/2020  Reason for Admission: Patient was admitted under IVC with complaints that he had been threatening towards the mother of his child.  Appeared to be somewhat hyperverbal and hyperactive possibly hypomanic  Principal Problem: Adjustment disorder with mixed disturbance of emotions and conduct Discharge Diagnoses: Principal Problem:   Adjustment disorder with mixed disturbance of emotions and conduct Active Problems:   Cannabis abuse   Past Psychiatric History: Patient reports treatment for ADHD as a child no other psychiatric history  Past Medical History: History reviewed. No pertinent past medical history. History reviewed. No pertinent surgical history. Family History: History reviewed. No pertinent family history. Family Psychiatric  History: Does not report any Social History:  Social History   Substance and Sexual Activity  Alcohol Use Yes     Social History   Substance and Sexual Activity  Drug Use Yes  . Frequency: 1.0 times per week  . Types: Marijuana    Social History   Socioeconomic History  . Marital status: Single    Spouse name: Not on file  . Number of children: Not on file  . Years of education: Not on file  . Highest education level: Not on file  Occupational History  . Not on file  Tobacco Use  . Smoking status: Never Smoker  . Smokeless tobacco: Never Used  Substance and Sexual Activity  . Alcohol use: Yes  . Drug use: Yes    Frequency: 1.0 times per week    Types: Marijuana  . Sexual activity: Not on file  Other Topics Concern  . Not on file  Social History Narrative  . Not on file   Social Determinants of Health   Financial  Resource Strain:   . Difficulty of Paying Living Expenses:   Food Insecurity:   . Worried About Programme researcher, broadcasting/film/video in the Last Year:   . Barista in the Last Year:   Transportation Needs:   . Freight forwarder (Medical):   Marland Kitchen Lack of Transportation (Non-Medical):   Physical Activity:   . Days of Exercise per Week:   . Minutes of Exercise per Session:   Stress:   . Feeling of Stress :   Social Connections:   . Frequency of Communication with Friends and Family:   . Frequency of Social Gatherings with Friends and Family:   . Attends Religious Services:   . Active Member of Clubs or Organizations:   . Attends Banker Meetings:   Marland Kitchen Marital Status:     Hospital Course: Admitted to psychiatric unit.  15-minute checks maintained.  Patient was animated although not necessarily agitated.  Spent a lot of time on the telephone.  Affect generally seemed to be upbeat.  He talked a lot but was not nonsensical or bizarre and did not appear to be psychotic.  I discussed with him the possibility of bipolar disorder.  Patient made it clear that he did not believe that he was off of his baseline and that he would not take any medication.  He completely denied any suicidal or homicidal ideations specifically towards the mother of his baby the baby or anyone else.  He stated  that he intended to go back and stay with his brother in [place].  Patient acknowledged that smoking pot all day and doing nothing was not the most healthy lifestyle and suggested that he was planning to try and change that and get to work more.  He did meet with the representative from [facility] and was agreeable to follow-up treatment on the grounds that he wanted to get therapy about the death of his grandmother.  Given that the patient has symptoms that could possibly be hypomania but are not necessarily pathologic and that he is not currently showing signs of dangerousness and is agreeable to outpatient treatment he will  be discharged from the hospital.  He is strongly encouraged to stay away from the mother of his child and to not engage in any kind of threatening talk which he agrees to  Physical Findings: AIMS: Facial and Oral Movements Muscles of Facial Expression: None, normal Lips and Perioral Area: None, normal Jaw: None, normal Tongue: None, normal,Extremity Movements Upper (arms, wrists, hands, fingers): None, normal Lower (legs, knees, ankles, toes): None, normal, Trunk Movements Neck, shoulders, hips: None, normal, Overall Severity Severity of abnormal movements (highest score from questions above): None, normal Patient's awareness of abnormal movements (rate only patient's report): No Awareness, Dental Status Current problems with teeth and/or dentures?: No Does patient usually wear dentures?: No  CIWA:    COWS:     Musculoskeletal: Strength & Muscle Tone: within normal limits Gait & Station: normal Patient leans: N/A  Psychiatric Specialty Exam: Physical Exam Vitals and nursing note reviewed.  Constitutional:      Appearance: He is well-developed.  HENT:     Head: Normocephalic and atraumatic.  Eyes:     Conjunctiva/sclera: Conjunctivae normal.     Pupils: Pupils are equal, round, and reactive to light.  Cardiovascular:     Heart sounds: Normal heart sounds.  Pulmonary:     Effort: Pulmonary effort is normal.  Abdominal:     Palpations: Abdomen is soft.  Musculoskeletal:        General: Normal range of motion.     Cervical back: Normal range of motion.  Skin:    General: Skin is warm and dry.  Neurological:     General: No focal deficit present.     Mental Status: He is alert.  Psychiatric:        Mood and Affect: Mood normal.     Review of Systems  Constitutional: Negative.   HENT: Negative.   Eyes: Negative.   Respiratory: Negative.   Cardiovascular: Negative.   Gastrointestinal: Negative.   Musculoskeletal: Negative.   Skin: Negative.   Neurological:  Negative.   Psychiatric/Behavioral: Negative.     Blood pressure 113/70, pulse 86, temperature 98.2 F (36.8 C), temperature source Oral, resp. rate 18, height 6' (1.829 m), weight 81.6 kg, SpO2 97 %.Body mass index is 24.41 kg/m.  General Appearance: Casual  Eye Contact:  Good  Speech:  Clear and Coherent  Volume:  Normal  Mood:  Euthymic  Affect:  Congruent  Thought Process:  Coherent  Orientation:  Full (Time, Place, and Person)  Thought Content:  Logical  Suicidal Thoughts:  No  Homicidal Thoughts:  No  Memory:  Immediate;   Fair Recent;   Fair Remote;   Fair  Judgement:  Fair  Insight:  Fair  Psychomotor Activity:  Normal  Concentration:  Concentration: Fair  Recall:  [employer] of Knowledge:  Fair  Language:  Fair  Akathisia:  No  Handed:  Right  AIMS (if indicated):     Assets:  Desire for Improvement Physical Health Resilience Social Support  ADL's:  Intact  Cognition:  WNL  Sleep:  Number of Hours: 8     Have you used any form of tobacco in the last 30 days? (Cigarettes, Smokeless Tobacco, Cigars, and/or Pipes): No  Has this patient used any form of tobacco in the last 30 days? (Cigarettes, Smokeless Tobacco, Cigars, and/or Pipes) Yes, No  Blood Alcohol level:  Lab Results  Component Value Date   ETH <10 05/12/2020    Metabolic Disorder Labs:  No results found for: HGBA1C, MPG No results found for: PROLACTIN No results found for: CHOL, TRIG, HDL, CHOLHDL, VLDL, LDLCALC  See Psychiatric Specialty Exam and Suicide Risk Assessment completed by Attending Physician prior to discharge.  Discharge destination:  Home  Is patient on multiple antipsychotic therapies at discharge:  No   Has Patient had three or more failed trials of antipsychotic monotherapy by history:  No  Recommended Plan for Multiple Antipsychotic Therapies: NA  Discharge Instructions    Diet - low sodium heart healthy   Complete by: As directed    Increase activity slowly    Complete by: As directed      Allergies as of 05/14/2020      Reactions   Septra [sulfamethoxazole-trimethoprim] Hives      Medication List    STOP taking these medications   cyclobenzaprine 5 MG tablet Commonly known as: FLEXERIL   ibuprofen 600 MG tablet Commonly known as: ADVIL       Follow-up Information    [facility] Follow up.   Why: Your appointment with Lorella Nimrod, Peer Support Specialist at 05/17/2020 at [facility] via Zoom.  Thanks! Contact information: [address] [place] [address] [phone]               Follow-up recommendations:  Activity:  Activity as tolerated Diet:  Regular diet Other:  Encourage follow-up with [facility]  Comments: Patient was educated about the dangers of ongoing substance abuse and strongly encouraged to follow-up with outpatient treatment.  At the time of discharge he no longer appears to be showing signs of acute clear-cut mental illness nor of any dangerousness and no longer meets commitment criteria  Signed: Mordecai Rasmussen, MD 05/14/2020, 9:42 AM

## 2020-05-14 NOTE — Plan of Care (Signed)
Pt denies depression, anxiety, SI, HI and AVH. Pt was educated on care plan and verbalizes understanding. Pt was encouraged to attend groups until discharge. Torrie Mayers RN Problem: Education: Goal: Knowledge of  General Education information/materials will improve Outcome: Adequate for Discharge Goal: Emotional status will improve Outcome: Adequate for Discharge Goal: Mental status will improve Outcome: Adequate for Discharge Goal: Verbalization of understanding the information provided will improve Outcome: Adequate for Discharge   Problem: Activity: Goal: Interest or engagement in activities will improve Outcome: Adequate for Discharge Goal: Sleeping patterns will improve Outcome: Adequate for Discharge   Problem: Coping: Goal: Ability to verbalize frustrations and anger appropriately will improve Outcome: Adequate for Discharge Goal: Ability to demonstrate self-control will improve Outcome: Adequate for Discharge   Problem: Health Behavior/Discharge Planning: Goal: Identification of resources available to assist in meeting health care needs will improve Outcome: Adequate for Discharge Goal: Compliance with treatment plan for underlying cause of condition will improve Outcome: Adequate for Discharge   Problem: Physical Regulation: Goal: Ability to maintain clinical measurements within normal limits will improve Outcome: Adequate for Discharge   Problem: Safety: Goal: Periods of time without injury will increase Outcome: Adequate for Discharge   Problem: Education: Goal: Utilization of techniques to improve thought processes will improve Outcome: Adequate for Discharge Goal: Knowledge of the prescribed therapeutic regimen will improve Outcome: Adequate for Discharge   Problem: Activity: Goal: Interest or engagement in leisure activities will improve Outcome: Adequate for Discharge Goal: Imbalance in normal sleep/wake cycle will improve Outcome: Adequate  for Discharge   Problem: Coping: Goal: Coping ability will improve Outcome: Adequate for Discharge Goal: Will verbalize feelings Outcome: Adequate for Discharge   Problem: Health Behavior/Discharge Planning: Goal: Ability to make decisions will improve Outcome: Adequate for Discharge Goal: Compliance with therapeutic regimen will improve Outcome: Adequate for Discharge   Problem: Role Relationship: Goal: Will demonstrate positive changes in social behaviors and relationships Outcome: Adequate for Discharge   Problem: Safety: Goal: Ability to disclose and discuss suicidal ideas will improve Outcome: Adequate for Discharge Goal: Ability to identify and utilize support systems that promote safety will improve Outcome: Adequate for Discharge   Problem: Self-Concept: Goal: Will verbalize positive feelings about self Outcome: Adequate for Discharge Goal: Level of anxiety will decrease Outcome: Adequate for Discharge
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
## 2023-01-26 ENCOUNTER — Ambulatory Visit (INDEPENDENT_AMBULATORY_CARE_PROVIDER_SITE_OTHER): Payer: Self-pay | Admitting: Gastroenterology
